# Patient Record
Sex: Female | Born: 1960 | Race: Black or African American | Hispanic: No | Smoking: Never smoker
Health system: Southern US, Community
[De-identification: ages and names within clinical notes are randomized; demographics above are authoritative.]

## PROBLEM LIST (undated history)

## (undated) DIAGNOSIS — I10 Essential (primary) hypertension: Secondary | ICD-10-CM

## (undated) HISTORY — DX: Essential (primary) hypertension: I10

---

## 2009-04-13 ENCOUNTER — Ambulatory Visit (HOSPITAL_COMMUNITY): Admission: RE | Admit: 2009-04-13 | Discharge: 2009-04-13 | Payer: Self-pay | Admitting: Family Medicine

## 2009-04-28 ENCOUNTER — Ambulatory Visit (HOSPITAL_COMMUNITY): Admission: RE | Admit: 2009-04-28 | Discharge: 2009-04-28 | Payer: Self-pay | Admitting: Family Medicine

## 2009-11-16 ENCOUNTER — Ambulatory Visit (HOSPITAL_COMMUNITY): Admission: RE | Admit: 2009-11-16 | Discharge: 2009-11-16 | Payer: Self-pay | Admitting: Family Medicine

## 2010-06-21 ENCOUNTER — Ambulatory Visit (HOSPITAL_COMMUNITY): Admission: RE | Admit: 2010-06-21 | Discharge: 2010-06-21 | Payer: Self-pay | Admitting: Family Medicine

## 2011-08-28 ENCOUNTER — Other Ambulatory Visit (HOSPITAL_COMMUNITY): Payer: Self-pay | Admitting: Family Medicine

## 2011-08-28 DIAGNOSIS — Z09 Encounter for follow-up examination after completed treatment for conditions other than malignant neoplasm: Secondary | ICD-10-CM

## 2011-09-05 ENCOUNTER — Ambulatory Visit (HOSPITAL_COMMUNITY)
Admission: RE | Admit: 2011-09-05 | Discharge: 2011-09-05 | Disposition: A | Discharge status: Home / Self Care | Payer: PRIVATE HEALTH INSURANCE | Source: Ambulatory Visit | Attending: Family Medicine | Admitting: Family Medicine

## 2011-09-05 DIAGNOSIS — Z09 Encounter for follow-up examination after completed treatment for conditions other than malignant neoplasm: Secondary | ICD-10-CM | POA: Insufficient documentation

## 2011-09-05 DIAGNOSIS — N63 Unspecified lump in unspecified breast: Secondary | ICD-10-CM | POA: Insufficient documentation

## 2013-07-28 ENCOUNTER — Other Ambulatory Visit (HOSPITAL_COMMUNITY): Payer: Self-pay | Admitting: *Deleted

## 2013-07-28 DIAGNOSIS — Z139 Encounter for screening, unspecified: Secondary | ICD-10-CM

## 2013-08-18 ENCOUNTER — Ambulatory Visit (HOSPITAL_COMMUNITY): Payer: Self-pay

## 2013-09-17 ENCOUNTER — Ambulatory Visit (HOSPITAL_COMMUNITY): Payer: Self-pay

## 2013-09-21 ENCOUNTER — Ambulatory Visit (HOSPITAL_COMMUNITY)
Admission: RE | Admit: 2013-09-21 | Discharge: 2013-09-21 | Disposition: A | Discharge status: Home / Self Care | Payer: PRIVATE HEALTH INSURANCE | Source: Ambulatory Visit | Attending: *Deleted | Admitting: *Deleted

## 2013-09-21 DIAGNOSIS — Z139 Encounter for screening, unspecified: Secondary | ICD-10-CM

## 2013-09-21 DIAGNOSIS — Z1231 Encounter for screening mammogram for malignant neoplasm of breast: Secondary | ICD-10-CM | POA: Insufficient documentation

## 2014-10-07 ENCOUNTER — Other Ambulatory Visit (HOSPITAL_COMMUNITY): Payer: Self-pay | Admitting: *Deleted

## 2014-10-07 DIAGNOSIS — Z1231 Encounter for screening mammogram for malignant neoplasm of breast: Secondary | ICD-10-CM

## 2014-10-11 ENCOUNTER — Ambulatory Visit (HOSPITAL_COMMUNITY): Payer: Self-pay

## 2014-10-13 ENCOUNTER — Ambulatory Visit (HOSPITAL_COMMUNITY): Payer: Self-pay

## 2014-10-25 ENCOUNTER — Ambulatory Visit (HOSPITAL_COMMUNITY)
Admission: RE | Admit: 2014-10-25 | Discharge: 2014-10-25 | Disposition: A | Discharge status: Home / Self Care | Payer: PRIVATE HEALTH INSURANCE | Source: Ambulatory Visit | Attending: *Deleted | Admitting: *Deleted

## 2014-10-25 DIAGNOSIS — Z1231 Encounter for screening mammogram for malignant neoplasm of breast: Secondary | ICD-10-CM | POA: Diagnosis present

## 2014-11-03 ENCOUNTER — Other Ambulatory Visit (HOSPITAL_COMMUNITY): Payer: Self-pay | Admitting: *Deleted

## 2014-11-03 DIAGNOSIS — Z1231 Encounter for screening mammogram for malignant neoplasm of breast: Secondary | ICD-10-CM

## 2014-11-11 ENCOUNTER — Ambulatory Visit (HOSPITAL_COMMUNITY): Payer: PRIVATE HEALTH INSURANCE

## 2021-10-24 ENCOUNTER — Encounter: Payer: PRIVATE HEALTH INSURANCE | Admitting: Adult Health

## 2021-11-17 ENCOUNTER — Encounter: Payer: 59 | Admitting: Adult Health

## 2022-04-25 ENCOUNTER — Other Ambulatory Visit (HOSPITAL_COMMUNITY): Payer: Self-pay | Admitting: *Deleted

## 2022-04-25 DIAGNOSIS — M545 Low back pain, unspecified: Secondary | ICD-10-CM

## 2022-10-16 ENCOUNTER — Other Ambulatory Visit (HOSPITAL_COMMUNITY): Payer: Self-pay | Admitting: *Deleted

## 2022-10-16 DIAGNOSIS — Z1231 Encounter for screening mammogram for malignant neoplasm of breast: Secondary | ICD-10-CM

## 2022-11-07 ENCOUNTER — Ambulatory Visit (HOSPITAL_COMMUNITY)
Admission: RE | Admit: 2022-11-07 | Discharge: 2022-11-07 | Disposition: A | Discharge status: Home / Self Care | Payer: PRIVATE HEALTH INSURANCE | Source: Ambulatory Visit | Attending: *Deleted | Admitting: *Deleted

## 2022-11-07 DIAGNOSIS — Z1231 Encounter for screening mammogram for malignant neoplasm of breast: Secondary | ICD-10-CM | POA: Insufficient documentation

## 2023-02-04 ENCOUNTER — Ambulatory Visit
Admission: EM | Admit: 2023-02-04 | Discharge: 2023-02-04 | Disposition: A | Discharge status: Home / Self Care | Payer: PRIVATE HEALTH INSURANCE

## 2023-02-04 DIAGNOSIS — M5442 Lumbago with sciatica, left side: Secondary | ICD-10-CM

## 2023-02-04 MED ORDER — PREDNISONE 20 MG PO TABS: 40.0000 mg | ORAL_TABLET | Freq: Every day | ORAL | 0 refills | Status: AC

## 2023-02-04 MED ORDER — DEXAMETHASONE SODIUM PHOSPHATE 10 MG/ML IJ SOLN
10.0000 mg | INTRAMUSCULAR | Status: AC
  Administered 2023-02-04: 10 mg via INTRAMUSCULAR

## 2023-02-04 MED ORDER — KETOROLAC TROMETHAMINE 30 MG/ML IJ SOLN
30.0000 mg | Freq: Once | INTRAMUSCULAR | Status: AC
  Administered 2023-02-04: 30 mg via INTRAMUSCULAR

## 2023-02-04 NOTE — ED Triage Notes (Signed)
Back pain on left side that shots down her leg. That started yesterday. Taking ibuprofen and Biofreeze with no relief of pain.

## 2023-02-04 NOTE — Discharge Instructions (Addendum)
You were given Decadron 10 mg and Toradol 30 mg.  Do not take any more ibuprofen tonight. Take medication as prescribed.  Start prednisone on 02/05/2023.  While you are taking the prednisone, you can take Tylenol arthritis strength 650 mg tablets for breakthrough pain.  Do not take ibuprofen while you are taking prednisone. May apply ice or heat as needed.  Apply ice for pain or swelling, heat for spasm or stiffness.  Apply for 20 minutes, remove for 1 hour, then repeat as needed. Perform the stretching exercises provided to help decrease recovery time. Go to the emergency department immediately if you experience loss of bowel or bladder function, inability to walk or bear weight, or severe numbness or tingling. If symptoms do not improve with this treatment, recommend following up with your primary care physician or with orthopedics for further evaluation.  I have given you the information for [facility] and for [facility] [place]. Follow-up as needed.

## 2023-02-04 NOTE — ED Provider Notes (Signed)
[facility]    CSN: [id number] Arrival date & time: 02/04/23  1731      History   Chief Complaint Chief Complaint  Patient presents with   Back Pain    HPI Stacie Morales is a 62 y.o. female.   The history is provided by the patient.   The patient presents for complaints of pain in the left buttock that radiates down into the left leg.  Patient reports that she has a history of sciatica.  Patient states symptoms started 1 day ago.  She states that the pain "hurts".  She also complains of numbness and tingling in the left lower extremity.  Patient denies injury or trauma, loss of bowel or bladder function, lower extremity weakness, or inability to bear weight.  Patient reports she has been using Biofreeze and ibuprofen with minimal relief.  Patient reports history of chronic back pain and sciatica.  Past Medical History:  Diagnosis Date   Hypertension     There are no problems to display for this patient.   History reviewed. No pertinent surgical history.  OB History   No obstetric history on file.      Home Medications    Prior to Admission medications   Medication Sig Start Date End Date Taking? Authorizing Provider  amLODipine (NORVASC) 10 MG tablet Take 10 mg by mouth daily. 01/14/23  Yes [provider]  gabapentin (NEURONTIN) 300 MG capsule Take 600 mg by mouth at bedtime as needed. 01/10/23  Yes [provider]  omeprazole (PRILOSEC) 20 MG capsule Take 20 mg by mouth daily. 01/18/23  Yes [provider]  rosuvastatin (CRESTOR) 5 MG tablet Take 5 mg by mouth at bedtime. 10/04/22  Yes [provider]    Family History History reviewed. No pertinent family history.  Social History Social History   Tobacco Use   Smoking status: Never   Smokeless tobacco: Never  Substance Use Topics   Alcohol use: Never   Drug use: Never     Allergies   Patient has no known allergies.   Review of Systems Review of  Systems Per HPI  Physical Exam Triage Vital Signs ED Triage Vitals  Encounter Vitals Group     BP 02/04/23 1855 (!) 156/92     Systolic BP Percentile --      Diastolic BP Percentile --      Pulse Rate 02/04/23 1855 97     Resp 02/04/23 1855 16     Temp 02/04/23 1855 98.9 F (37.2 C)     Temp Source 02/04/23 1855 Oral     SpO2 02/04/23 1855 97 %     Weight --      Height --      Head Circumference --      Peak Flow --      Pain Score 02/04/23 1858 9     Pain Loc --      Pain Education --      Exclude from Growth Chart --    No data found.  Updated Vital Signs BP (!) 156/92 (BP Location: Right Arm)   Pulse 97   Temp 98.9 F (37.2 C) (Oral)   Resp 16   SpO2 97%   Visual Acuity Right Eye Distance:   Left Eye Distance:   Bilateral Distance:    Right Eye Near:   Left Eye Near:    Bilateral Near:     Physical Exam Vitals and nursing note reviewed.  Constitutional:  General: She is not in acute distress.    Appearance: Normal appearance.  HENT:     Head: Normocephalic.  Eyes:     Extraocular Movements: Extraocular movements intact.     Pupils: Pupils are equal, round, and reactive to light.  Cardiovascular:     Rate and Rhythm: Normal rate and regular rhythm.     Pulses: Normal pulses.     Heart sounds: Normal heart sounds.  Pulmonary:     Effort: Pulmonary effort is normal.     Breath sounds: Normal breath sounds.  Abdominal:     General: Bowel sounds are normal.     Palpations: Abdomen is soft.     Tenderness: There is no abdominal tenderness.  Musculoskeletal:     Cervical back: Normal range of motion.       Legs:  Skin:    General: Skin is warm and dry.  Neurological:     General: No focal deficit present.     Mental Status: She is alert and oriented to person, place, and time.  Psychiatric:        Mood and Affect: Mood normal.        Behavior: Behavior normal.      UC Treatments / Results  Labs (all labs ordered are listed, but only  abnormal results are displayed) Labs Reviewed - No data to display  EKG   Radiology No results found.  Procedures Procedures (including critical care time)  Medications Ordered in UC Medications - No data to display  Initial Impression / Assessment and Plan / UC Course  I have reviewed the triage vital signs and the nursing notes.  Pertinent labs & imaging results that were available during my care of the patient were reviewed by me and considered in my medical decision making (see chart for details).  The patient is well-appearing, she is in no acute distress, she is hypertensive, vital signs are otherwise stable.  Symptoms consistent with sciatica.  Patient was given Decadron 10 mg IM and Toradol 30 mg IM.  Prednisone 40 mg for the next 5 days was prescribed.  Supportive care recommendations were provided and discussed with the patient to include the use of ice or heat as needed, gentle stretching and range of motion exercises, and use of Tylenol arthritis strength 650 mg tablets as needed for breakthrough pain or discomfort.  Patient was given strict ER follow-up precautions.  Patient was advised to follow-up with her PCP, [facility] or [facility] [place] if symptoms fail to improve.  Patient is in agreement with this plan of care and verbalizes understanding.  All questions were answered.  Patient is stable for discharge.   Final Clinical Impressions(s) / UC Diagnoses   Final diagnoses:  None   Discharge Instructions   None    ED Prescriptions   None    PDMP not reviewed this encounter.   Abran Cantor, NP 02/04/23 1916
